# Patient Record
Sex: Female | Born: 2000 | Race: Black or African American | Hispanic: No | Marital: Single | Smoking: Current some day smoker
Health system: Southern US, Community
[De-identification: ages and names within clinical notes are randomized; demographics above are authoritative.]

## PROBLEM LIST (undated history)

## (undated) HISTORY — PX: BREAST REDUCTION SURGERY: SHX8

---

## 2020-10-28 ENCOUNTER — Other Ambulatory Visit: Payer: Self-pay

## 2020-10-28 ENCOUNTER — Ambulatory Visit (HOSPITAL_COMMUNITY)
Admission: EM | Admit: 2020-10-28 | Discharge: 2020-10-28 | Disposition: A | Discharge status: Home / Self Care | Payer: 59

## 2020-10-28 DIAGNOSIS — F4323 Adjustment disorder with mixed anxiety and depressed mood: Secondary | ICD-10-CM
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
## 2020-10-28 NOTE — ED Notes (Signed)
Pt discharged home.  Discharge instructions reviewed with pt.  Pt alert, oriented, and ambulatory.  Safety maintained
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
## 2020-10-28 NOTE — Discharge Instructions (Addendum)

## 2021-01-13 ENCOUNTER — Emergency Department (HOSPITAL_COMMUNITY): Payer: 59

## 2021-01-13 ENCOUNTER — Emergency Department (HOSPITAL_COMMUNITY)
Admission: EM | Admit: 2021-01-13 | Discharge: 2021-01-14 | Disposition: A | Discharge status: Home / Self Care | Payer: 59 | Attending: Physician Assistant | Admitting: Physician Assistant

## 2021-01-13 ENCOUNTER — Encounter (HOSPITAL_COMMUNITY): Payer: Self-pay | Admitting: Emergency Medicine

## 2021-01-13 ENCOUNTER — Other Ambulatory Visit: Payer: Self-pay

## 2021-01-13 DIAGNOSIS — J3489 Other specified disorders of nose and nasal sinuses: Secondary | ICD-10-CM | POA: Diagnosis not present

## 2021-01-13 DIAGNOSIS — Z5321 Procedure and treatment not carried out due to patient leaving prior to being seen by health care provider: Secondary | ICD-10-CM | POA: Insufficient documentation

## 2021-01-13 DIAGNOSIS — Z20822 Contact with and (suspected) exposure to covid-19: Secondary | ICD-10-CM | POA: Diagnosis not present

## 2021-01-13 DIAGNOSIS — R059 Cough, unspecified: Secondary | ICD-10-CM | POA: Diagnosis not present

## 2021-01-13 DIAGNOSIS — R0981 Nasal congestion: Secondary | ICD-10-CM | POA: Insufficient documentation

## 2021-01-13 LAB — CBC WITH DIFFERENTIAL/PLATELET
Abs Immature Granulocytes: 0.04 10*3/uL (ref 0.00–0.07)
Basophils Absolute: 0 10*3/uL (ref 0.0–0.1)
Basophils Relative: 0 %
Eosinophils Absolute: 0 10*3/uL (ref 0.0–0.5)
Eosinophils Relative: 0 %
HCT: 42.3 % (ref 36.0–46.0)
Hemoglobin: 13.8 g/dL (ref 12.0–15.0)
Immature Granulocytes: 0 %
Lymphocytes Relative: 11 %
Lymphs Abs: 1.1 10*3/uL (ref 0.7–4.0)
MCH: 26.1 pg (ref 26.0–34.0)
MCHC: 32.6 g/dL (ref 30.0–36.0)
MCV: 80.1 fL (ref 80.0–100.0)
Monocytes Absolute: 1 10*3/uL (ref 0.1–1.0)
Monocytes Relative: 9 %
Neutro Abs: 8.5 10*3/uL — ABNORMAL HIGH (ref 1.7–7.7)
Neutrophils Relative %: 80 %
Platelets: 271 10*3/uL (ref 150–400)
RBC: 5.28 MIL/uL — ABNORMAL HIGH (ref 3.87–5.11)
RDW: 14.6 % (ref 11.5–15.5)
WBC: 10.7 10*3/uL — ABNORMAL HIGH (ref 4.0–10.5)
nRBC: 0 % (ref 0.0–0.2)

## 2021-01-13 LAB — COMPREHENSIVE METABOLIC PANEL
ALT: 19 U/L (ref 0–44)
AST: 22 U/L (ref 15–41)
Albumin: 3.9 g/dL (ref 3.5–5.0)
Alkaline Phosphatase: 53 U/L (ref 38–126)
Anion gap: 12 (ref 5–15)
BUN: 8 mg/dL (ref 6–20)
CO2: 21 mmol/L — ABNORMAL LOW (ref 22–32)
Calcium: 9 mg/dL (ref 8.9–10.3)
Chloride: 100 mmol/L (ref 98–111)
Creatinine, Ser: 0.83 mg/dL (ref 0.44–1.00)
GFR, Estimated: >60 mL/min (ref >60)
Glucose, Bld: 97 mg/dL (ref 70–99)
Potassium: 3.7 mmol/L (ref 3.5–5.1)
Sodium: 133 mmol/L — ABNORMAL LOW (ref 135–145)
Total Bilirubin: 0.7 mg/dL (ref 0.3–1.2)
Total Protein: 8 g/dL (ref 6.5–8.1)

## 2021-01-13 LAB — I-STAT BETA HCG BLOOD, ED (MC, WL, AP ONLY): I-stat hCG, quantitative: <5 m[IU]/mL (ref <5)

## 2021-01-13 LAB — RESP PANEL BY RT-PCR (FLU A&B, COVID) ARPGX2
Influenza A by PCR: POSITIVE — AB
Influenza B by PCR: NEGATIVE
SARS Coronavirus 2 by RT PCR: NEGATIVE

## 2021-01-13 LAB — LIPASE, BLOOD: Lipase: 29 U/L (ref 11–51)

## 2021-01-13 LAB — TROPONIN I (HIGH SENSITIVITY): Troponin I (High Sensitivity): 4 ng/L (ref <18)

## 2021-01-13 NOTE — ED Triage Notes (Signed)
Patient reports persistent productive cough with nasal congestion/rhinorrhea for several days despite antibiotic/steroid treatment prescribed last week .

## 2021-01-13 NOTE — ED Provider Notes (Signed)
Emergency Medicine Provider Triage Evaluation Note  Jessica Buchanan , a 20 y.o. female  was evaluated in triage.  Pt complains of cough, and shortness of breath.  She has been sick since about 10/13.  She initially started with strep throat.  She was treated with azithromycin.  She has been given steroids.  She has been feeling somewhat better however over the past 2 days has been much worse again.  She is having ongoing cough, nasal congestion, lower chest pain/upper abdominal pain, and generally feeling worse and unwell.  She reports hot and cold sweats.  She has had negative COVID test since this started.  Unknown about flu testing.  Review of Systems  Positive: Cough, shortness of breath, nasal congestion Negative: Syncope, leg swelling, hemoptysis  Physical Exam  BP (!) 137/92   Pulse (!) 120   Temp 98.5 F (36.9 C) (Oral)   Resp 16   Ht 5\' 7"  (1.702 m)   Wt 106 kg   LMP 12/21/2020   SpO2 98%   BMI 36.60 kg/m  Gen:   Awake, no distress   Resp:  Normal effort  MSK:   Moves extremities without difficulty  Other:  Patient is tachycardic.  Otherwise regular rhythm.  Lungs clear to auscultation bilaterally  Medical Decision Making  Medically screening exam initiated at 8:11 PM.  Appropriate orders placed.  Jessica Buchanan was informed that the remainder of the evaluation will be completed by another provider, this initial triage assessment does not replace that evaluation, and the importance of remaining in the ED until their evaluation is complete.   Given the patient has been sick for 2.5 weeks despite antibiotics, and is tachycardic here we will obtain labs, chest x-ray, EKG given her lower chest/upper abdominal pain, and sent for/COVID testing.   Jessica Buchanan 01/13/21 2013    [place], 2014, MD 01/13/21 [phone]

## 2021-01-14 ENCOUNTER — Encounter (HOSPITAL_COMMUNITY): Payer: Self-pay | Admitting: Emergency Medicine

## 2021-01-14 ENCOUNTER — Ambulatory Visit (HOSPITAL_COMMUNITY)
Admission: EM | Admit: 2021-01-14 | Discharge: 2021-01-14 | Disposition: A | Discharge status: Home / Self Care | Payer: 59

## 2021-01-14 ENCOUNTER — Other Ambulatory Visit: Payer: Self-pay

## 2021-01-14 DIAGNOSIS — J101 Influenza due to other identified influenza virus with other respiratory manifestations: Secondary | ICD-10-CM | POA: Diagnosis not present

## 2021-01-14 NOTE — ED Notes (Signed)
Pt decided to leave 

## 2021-01-14 NOTE — ED Provider Notes (Signed)
[facility]    CSN: [id number] Arrival date & time: 01/14/21  0814      History   Chief Complaint Chief Complaint  Patient presents with   Cough   Generalized Body Aches   Chills   Sore Throat    HPI Jessica Buchanan is a 20 y.o. female.   Patient presents with nonproductive cough, nasal congestion, rhinorrhea, chills, body aches, diarrhea ,sore throat for 3 days.  Poor appetite tolerating fluids.  Has attempted use of over-the-counter Mucinex for recurrent symptoms with minimal improvement.  Denies known sick contact.  No pertinent medical history.  Denies shortness of breath, wheezing, abdominal pain, nausea, vomiting, ear pain, headaches. Endorses recent illness that began 10/13 and diagnosed with strep throat, treated with antibiotics and steroids, symptoms began to improve.  Seen in the emergency department on 1030, left without being seen.   History reviewed. No pertinent past medical history.  There are no problems to display for this patient.   Past Surgical History:  Procedure Laterality Date   BREAST REDUCTION SURGERY      OB History   No obstetric history on file.      Home Medications    Prior to Admission medications   Not on File    Family History History reviewed. No pertinent family history.  Social History Social History   Tobacco Use   Smoking status: Some Days    Types: Cigarettes   Smokeless tobacco: Never  Substance Use Topics   Alcohol use: Never   Drug use: Never     Allergies   Patient has no allergy information on record.   Review of Systems Review of Systems Defer to HPI    Physical Exam Triage Vital Signs ED Triage Vitals  Enc Vitals Group     BP 01/14/21 0837 128/79     Pulse Rate 01/14/21 0837 (!) 113     Resp 01/14/21 0837 17     Temp 01/14/21 0837 98.3 F (36.8 C)     Temp Source 01/14/21 0837 Oral     SpO2 01/14/21 0837 100 %     Weight --      Height --      Head Circumference --      Peak  Flow --      Pain Score 01/14/21 0834 8     Pain Loc --      Pain Edu? --      Excl. in GC? --    No data found.  Updated Vital Signs BP 128/79 (BP Location: Left Arm)   Pulse (!) 113   Temp 98.3 F (36.8 C) (Oral)   Resp 17   LMP 12/21/2020   SpO2 100%   Visual Acuity Right Eye Distance:   Left Eye Distance:   Bilateral Distance:    Right Eye Near:   Left Eye Near:    Bilateral Near:     Physical Exam Constitutional:      Appearance: Normal appearance. She is normal weight.  HENT:     Head: Normocephalic.     Right Ear: Hearing, ear canal and external ear normal. A middle ear effusion is present.     Left Ear: Hearing, ear canal and external ear normal. A middle ear effusion is present.     Nose: Congestion and rhinorrhea present.     Mouth/Throat:     Mouth: Mucous membranes are moist.     Pharynx: Posterior oropharyngeal erythema present.  Eyes:  Extraocular Movements: Extraocular movements intact.  Cardiovascular:     Rate and Rhythm: Regular rhythm. Tachycardia present.     Pulses: Normal pulses.     Heart sounds: Normal heart sounds.  Pulmonary:     Effort: Pulmonary effort is normal.     Breath sounds: Normal breath sounds.  Abdominal:     General: Abdomen is flat. Bowel sounds are normal.     Palpations: Abdomen is soft.  Musculoskeletal:     Cervical back: Normal range of motion.  Lymphadenopathy:     Cervical: Cervical adenopathy present.  Skin:    General: Skin is warm and dry.  Neurological:     Mental Status: She is alert and oriented to person, place, and time. Mental status is at baseline.  Psychiatric:        Mood and Affect: Mood normal.        Behavior: Behavior normal.     UC Treatments / Results  Labs (all labs ordered are listed, but only abnormal results are displayed) Labs Reviewed - No data to display  EKG   Radiology DG Chest 2 View  Result Date: 01/13/2021 CLINICAL DATA:  Dyspnea EXAM: CHEST - 2 VIEW COMPARISON:   None. FINDINGS: The heart size and mediastinal contours are within normal limits. Both lungs are clear. The visualized skeletal structures are unremarkable. IMPRESSION: No active cardiopulmonary disease. Electronically Signed   By: Helyn Numbers M.D.   On: 01/13/2021 20:56    Procedures Procedures (including critical care time)  Medications Ordered in UC Medications - No data to display  Initial Impression / Assessment and Plan / UC Course  I have reviewed the triage vital signs and the nursing notes.  Pertinent labs & imaging results that were available during my care of the patient were reviewed by me and considered in my medical decision making (see chart for details).  Influenza A  flu testing completed in emergency department on 01/13/2021, discussed findings with patient, reviewed all lab work from ER visit, declined Tamiflu, will use over-the-counter medications for symptom management, can follow-up with urgent care as needed Final Clinical Impressions(s) / UC Diagnoses   Final diagnoses:  Influenza A     Discharge Instructions      You flu test was positive, your chest x-ray was negative for infection on 01/13/21 in the emergency room   You have declined use of tamiflu     You can take Tylenol 500 every 6 hours and/or Ibuprofen 800 mg every 8 hours as needed for fever reduction and pain relief.   For cough: honey 1/2 to 1 teaspoon (you can dilute the honey in water or another fluid).  You can also use guaifenesin and dextromethorphan for cough. You can use a humidifier for chest congestion and cough.  If you don't have a humidifier, you can sit in the bathroom with the hot shower running.      For sore throat: try warm salt water gargles, cepacol lozenges, throat spray, warm tea or water with lemon/honey, popsicles or ice, or OTC cold relief medicine for throat discomfort.   For congestion: take a daily anti-histamine like Zyrtec, Claritin, and a oral decongestant, such  as pseudoephedrine.  You can also use Flonase 1-2 sprays in each nostril daily.   It is important to stay hydrated: drink plenty of fluids (water, gatorade/powerade/pedialyte, juices, or teas) to keep your throat moisturized and help further relieve irritation/discomfort.     ED Prescriptions   None    PDMP  not reviewed this encounter.   Valinda Hoar, [place] 01/14/21 [phone]

## 2021-01-14 NOTE — Discharge Instructions (Signed)
You flu test was positive, your chest x-ray was negative for infection on 01/13/21 in the emergency room   You have declined use of tamiflu     You can take Tylenol 500 every 6 hours and/or Ibuprofen 800 mg every 8 hours as needed for fever reduction and pain relief.   For cough: honey 1/2 to 1 teaspoon (you can dilute the honey in water or another fluid).  You can also use guaifenesin and dextromethorphan for cough. You can use a humidifier for chest congestion and cough.  If you don't have a humidifier, you can sit in the bathroom with the hot shower running.      For sore throat: try warm salt water gargles, cepacol lozenges, throat spray, warm tea or water with lemon/honey, popsicles or ice, or OTC cold relief medicine for throat discomfort.   For congestion: take a daily anti-histamine like Zyrtec, Claritin, and a oral decongestant, such as pseudoephedrine.  You can also use Flonase 1-2 sprays in each nostril daily.   It is important to stay hydrated: drink plenty of fluids (water, gatorade/powerade/pedialyte, juices, or teas) to keep your throat moisturized and help further relieve irritation/discomfort.

## 2021-01-14 NOTE — ED Triage Notes (Signed)
Pt presents with cough, congestion, chills, sore throat and body aches xs 3 days. States was diagnosed with bronchitis on the 20th.

## 2022-10-18 IMAGING — CR DG CHEST 2V
2 series · 2 of 2 positions shown · non-contrast
Comparison: None.

CLINICAL DATA: Dyspnea

EXAM:
CHEST - 2 VIEW

[chest pa]
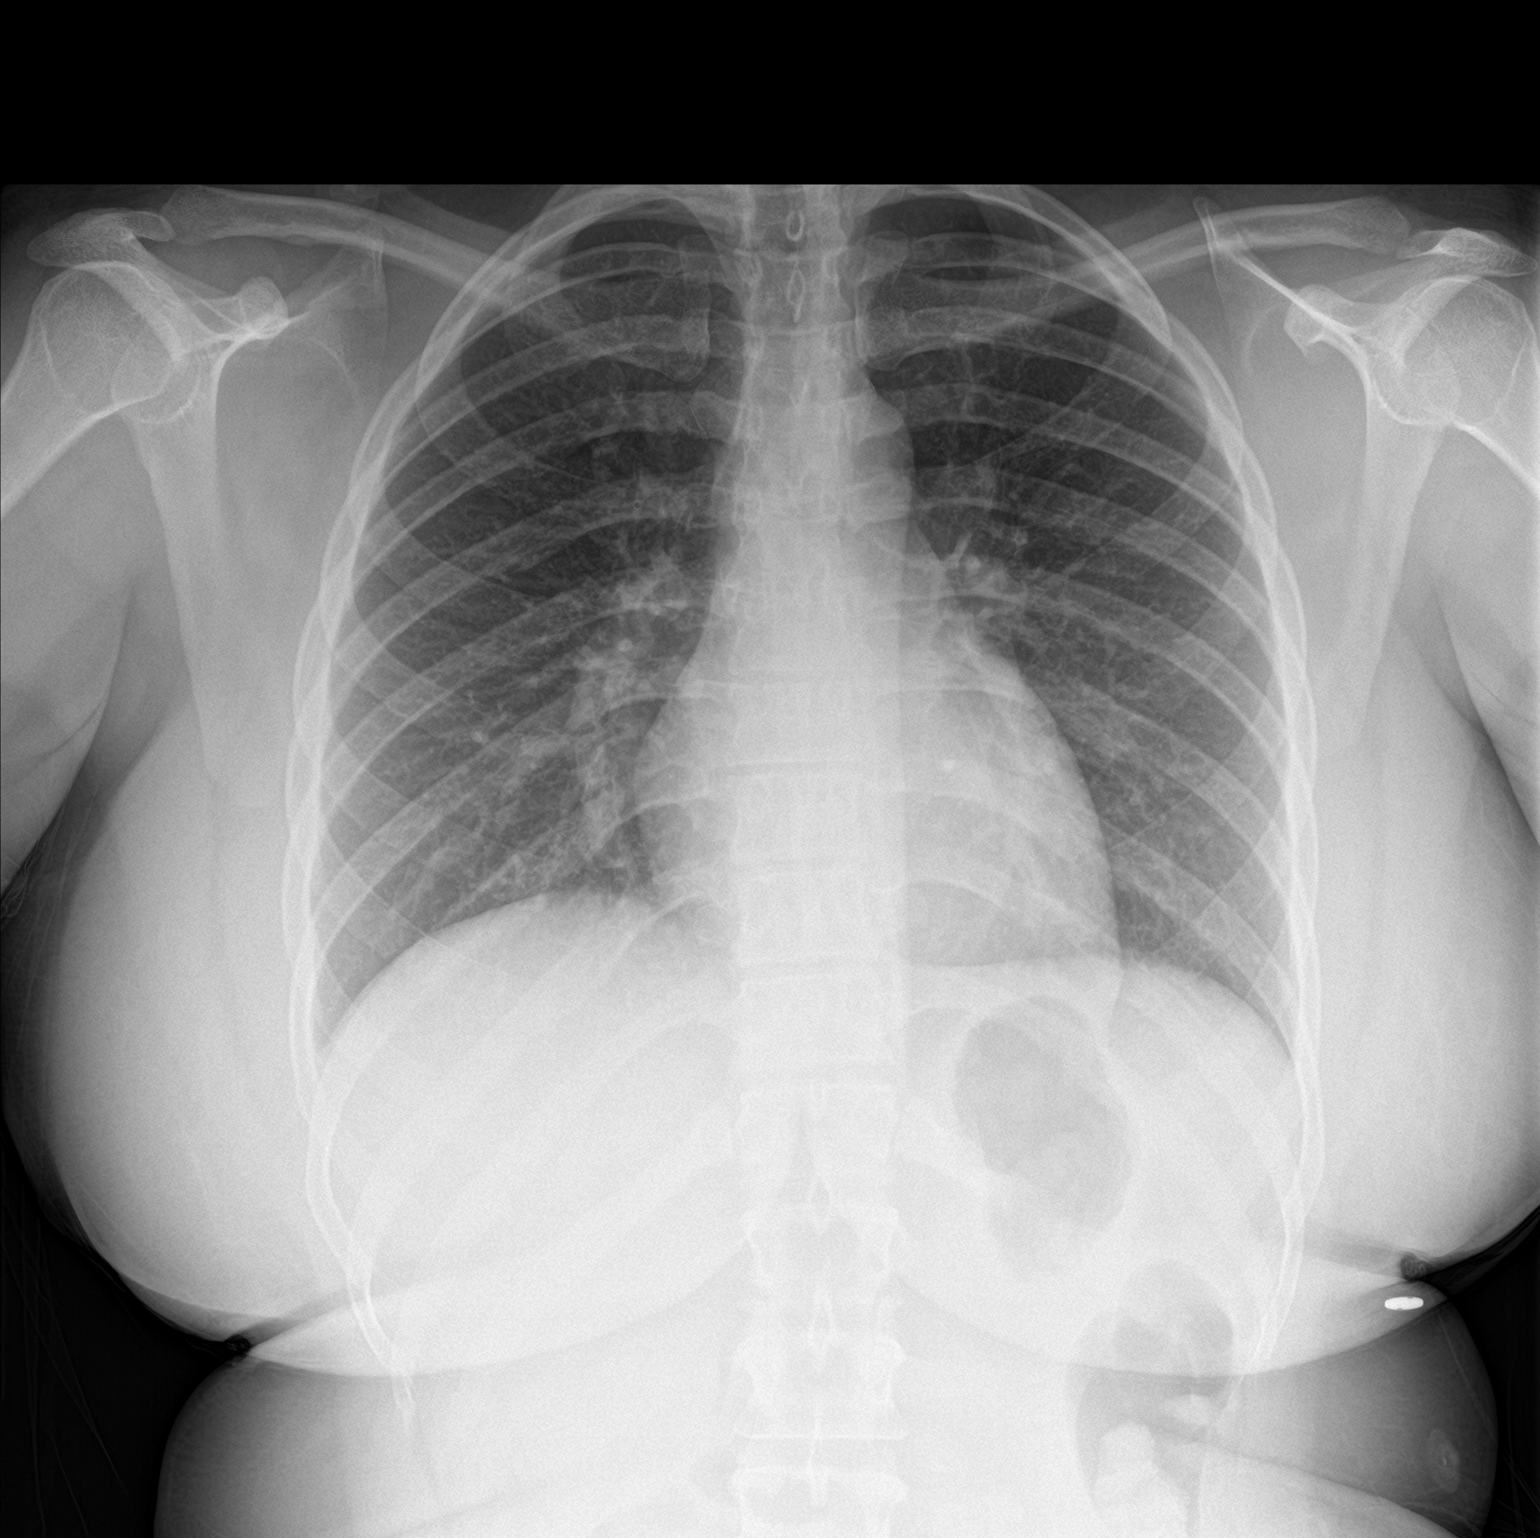

[chest lat]
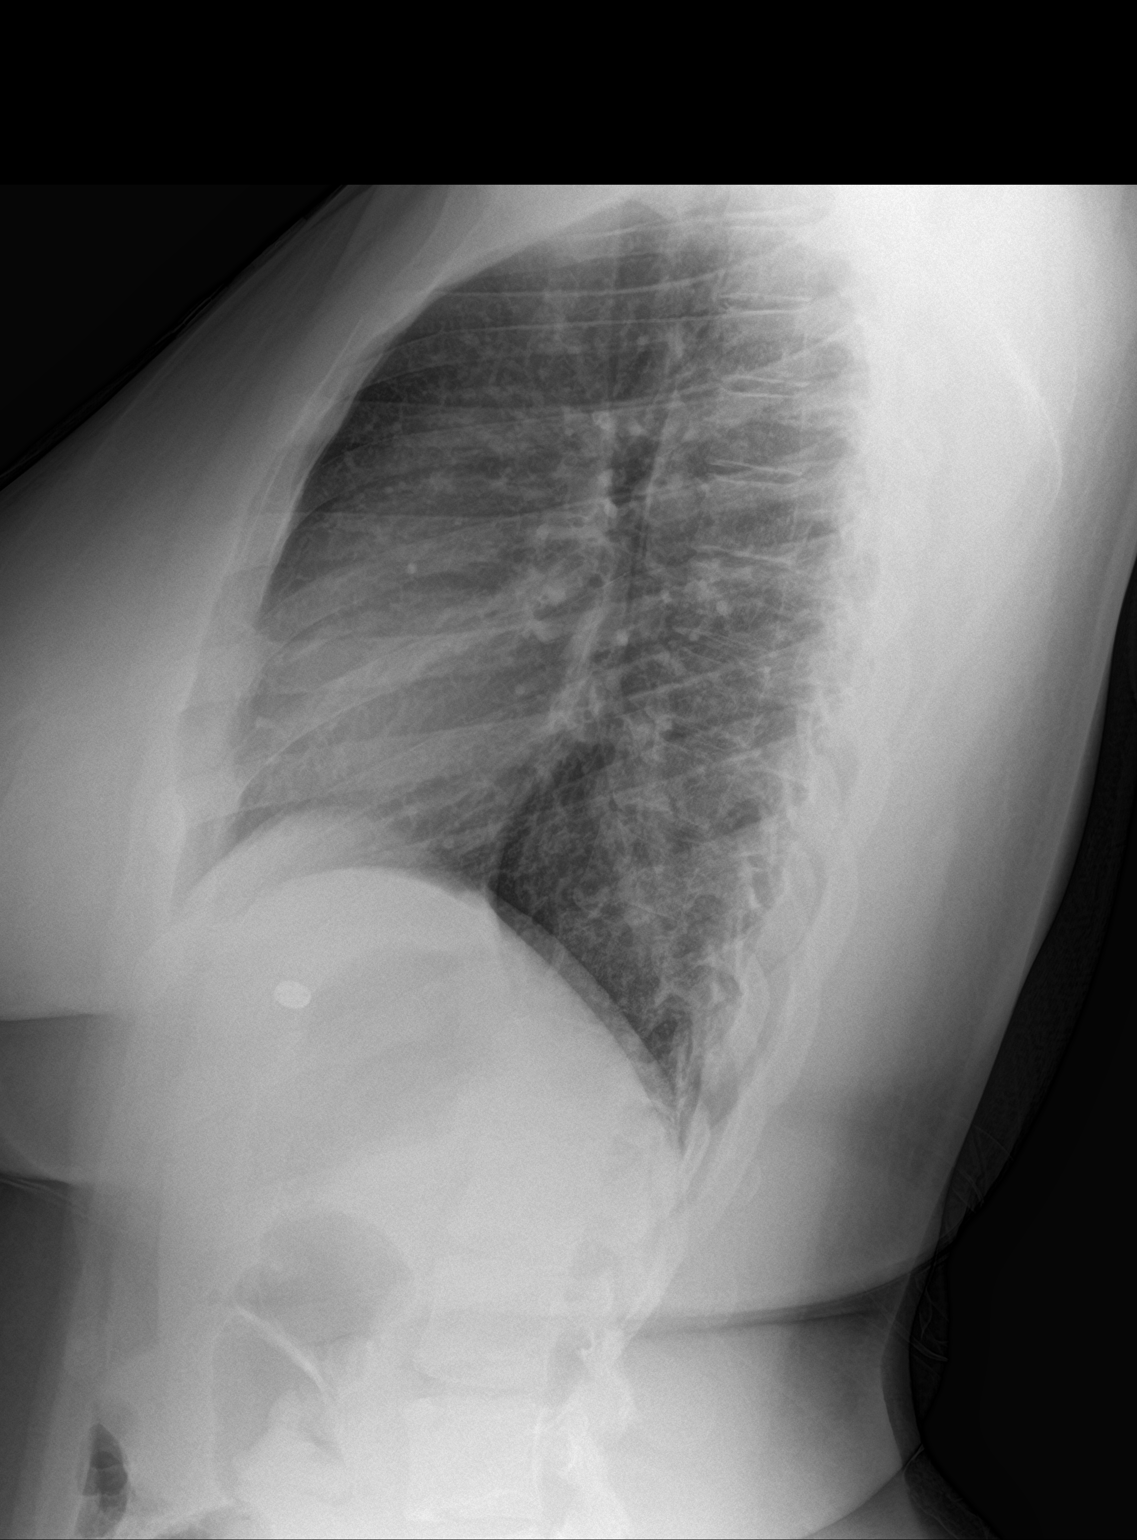

[2 of 2 positions shown; findings below may reference images not displayed]

FINDINGS: The heart size and mediastinal contours are within normal limits.
Both lungs are clear. The visualized skeletal structures are
unremarkable.
IMPRESSION: No active cardiopulmonary disease.
